# Patient Record
Sex: Male | Born: 1945 | Race: Black or African American | Hispanic: No | Marital: Married | Smoking: Former smoker
Health system: Southern US, Community
[De-identification: ages and names within clinical notes are randomized; demographics above are authoritative.]

## PROBLEM LIST (undated history)

## (undated) ENCOUNTER — Emergency Department: Admission: EM | Source: Home / Self Care

## (undated) DIAGNOSIS — I10 Essential (primary) hypertension: Secondary | ICD-10-CM

## (undated) DIAGNOSIS — J302 Other seasonal allergic rhinitis: Secondary | ICD-10-CM

## (undated) DIAGNOSIS — E559 Vitamin D deficiency, unspecified: Secondary | ICD-10-CM

## (undated) DIAGNOSIS — N400 Enlarged prostate without lower urinary tract symptoms: Secondary | ICD-10-CM

## (undated) HISTORY — DX: Vitamin D deficiency, unspecified: E55.9

## (undated) HISTORY — DX: Essential (primary) hypertension: I10

## (undated) HISTORY — DX: Other seasonal allergic rhinitis: J30.2

## (undated) HISTORY — DX: Benign prostatic hyperplasia without lower urinary tract symptoms: N40.0

---

## 2006-08-19 ENCOUNTER — Ambulatory Visit (HOSPITAL_COMMUNITY): Admission: RE | Admit: 2006-08-19 | Discharge: 2006-08-19 | Payer: Self-pay | Admitting: Internal Medicine

## 2006-10-24 ENCOUNTER — Ambulatory Visit (HOSPITAL_BASED_OUTPATIENT_CLINIC_OR_DEPARTMENT_OTHER): Admission: RE | Admit: 2006-10-24 | Discharge: 2006-10-24 | Payer: Self-pay | Admitting: General Surgery

## 2006-10-24 ENCOUNTER — Encounter (INDEPENDENT_AMBULATORY_CARE_PROVIDER_SITE_OTHER): Payer: Self-pay | Admitting: Specialist

## 2007-05-05 ENCOUNTER — Encounter: Admission: RE | Admit: 2007-05-05 | Discharge: 2007-05-05 | Payer: Self-pay | Admitting: Internal Medicine

## 2011-01-15 NOTE — Op Note (Signed)
Joseph Pace, Joseph Pace            ACCOUNT NO.:  [id number]   MEDICAL RECORD NO.:  [id number]          PATIENT TYPE:  AMB   LOCATION:  DSC                          FACILITY:  [facility]   PHYSICIAN:  Leonie Man, M.D.   DATE OF BIRTH:  16-Nov-1945   DATE OF PROCEDURE:  10/24/2006  DATE OF DISCHARGE:                               OPERATIVE REPORT   PREOPERATIVE DIAGNOSIS:  Large epidermoid cyst of posterior neck.   POSTOPERATIVE DIAGNOSIS:  Large epidermoid cyst of posterior neck.   PROCEDURE:  Excision epidermoid cyst of posterior neck.   SURGEON:  Leonie Man, M.D.   ASSISTANT:  OR nurse.   ANESTHESIA:  General.   POSITION:  Prone.   DESCRIPTION OF PROCEDURE:  Joseph Pace is a 65 year old man with a  very large epidermoid cyst measuring approximately 10 cm in greatest  diameter located in the nape of the neck centrally.  He comes to the  operating room now for excision of this mass because of increasing  discomfort and inability to close his collar.  He understand the risks  and potential benefits of surgery.   PROCEDURE:  Following the induction of satisfactory general anesthesia,  the patient is positioned in the prone position and the posterior neck  is prepped and draped to be included in a sterile operative field.  I  infiltrated this region with 0.5% Marcaine with epinephrine and then  made an elliptical incision around the region of the sebaceous cyst  inclusive of the pore.  This was deepened through the skin and  subcutaneous tissues down to the capsule of the cyst.  The cyst was  dissected free from the surrounding subcutaneous tissues, then carried  down to the trapezius muscles at the base of the neck.  The cyst was  then removed in its entirety and forwarded for pathologic evaluation.  Hemostasis assured with electrocautery and the wound cavity irrigated  with multiple aliquots of normal saline.  I used the remaining fatty  tissue of the neck to  reapproximate and close the dead space off with  interrupted 3-0 Vicryl sutures and the skin was closed with running 4-0  Monocryl suture and then reinforced with Steri-Strips.  Sterile  dressings applied.  Anesthetic reversed.  The patient removed from the  operating room to the recovery room in stable condition.  Tolerated the  procedure well.      Leonie Man, M.D.  Electronically Signed     PB/MEDQ  D:  10/24/2006  T:  10/24/2006  Job:  [id number]

## 2011-11-10 ENCOUNTER — Ambulatory Visit (HOSPITAL_COMMUNITY): Admission: RE | Admit: 2011-11-10 | Payer: Self-pay | Source: Ambulatory Visit

## 2011-11-12 ENCOUNTER — Ambulatory Visit (HOSPITAL_COMMUNITY)
Admission: RE | Admit: 2011-11-12 | Discharge: 2011-11-12 | Disposition: A | Discharge status: Home / Self Care | Payer: Medicare Other | Source: Ambulatory Visit | Attending: Internal Medicine | Admitting: Internal Medicine

## 2011-11-12 DIAGNOSIS — R9431 Abnormal electrocardiogram [ECG] [EKG]: Secondary | ICD-10-CM

## 2011-11-12 DIAGNOSIS — I079 Rheumatic tricuspid valve disease, unspecified: Secondary | ICD-10-CM | POA: Insufficient documentation

## 2011-11-12 DIAGNOSIS — I10 Essential (primary) hypertension: Secondary | ICD-10-CM

## 2011-11-12 NOTE — Progress Notes (Signed)
  Echocardiogram 2D Echocardiogram has been performed.  Joseph Pace 11/12/2011, 11:46 AM

## 2011-12-21 ENCOUNTER — Other Ambulatory Visit: Payer: Self-pay | Admitting: Gastroenterology

## 2012-10-25 ENCOUNTER — Other Ambulatory Visit (HOSPITAL_COMMUNITY): Payer: Self-pay | Admitting: Internal Medicine

## 2012-11-01 ENCOUNTER — Ambulatory Visit (HOSPITAL_COMMUNITY)
Admission: RE | Admit: 2012-11-01 | Discharge: 2012-11-01 | Disposition: A | Discharge status: Home / Self Care | Payer: Medicare Other | Source: Ambulatory Visit | Attending: Internal Medicine | Admitting: Internal Medicine

## 2012-11-01 DIAGNOSIS — M79609 Pain in unspecified limb: Secondary | ICD-10-CM | POA: Insufficient documentation

## 2012-11-01 DIAGNOSIS — M25569 Pain in unspecified knee: Secondary | ICD-10-CM

## 2012-11-01 DIAGNOSIS — R0989 Other specified symptoms and signs involving the circulatory and respiratory systems: Secondary | ICD-10-CM

## 2012-11-01 NOTE — Progress Notes (Signed)
VASCULAR LAB PRELIMINARY  ARTERIAL  ABI completed:    RIGHT    LEFT    PRESSURE WAVEFORM  PRESSURE WAVEFORM  BRACHIAL 144 triphasic BRACHIAL 150 triphasic  DP 187 triphasic DP 167 triphasic         PT 184 triphasic PT 168 biphasic                  RIGHT LEFT  ABI 1.25 1.12   ABIs and Doppler waveforms are within normal limits bilaterally. Symptoms not consistent with claudication.  SLAUGHTER, [place], RVS 11/01/2012, 11:12 AM

## 2014-11-20 DIAGNOSIS — I10 Essential (primary) hypertension: Secondary | ICD-10-CM | POA: Diagnosis not present

## 2014-11-20 DIAGNOSIS — M150 Primary generalized (osteo)arthritis: Secondary | ICD-10-CM | POA: Diagnosis not present

## 2014-11-26 DIAGNOSIS — R7309 Other abnormal glucose: Secondary | ICD-10-CM | POA: Diagnosis not present

## 2014-11-26 DIAGNOSIS — I10 Essential (primary) hypertension: Secondary | ICD-10-CM | POA: Diagnosis not present

## 2015-05-28 DIAGNOSIS — Z0000 Encounter for general adult medical examination without abnormal findings: Secondary | ICD-10-CM | POA: Diagnosis not present

## 2015-11-19 DIAGNOSIS — J309 Allergic rhinitis, unspecified: Secondary | ICD-10-CM | POA: Diagnosis not present

## 2015-11-19 DIAGNOSIS — M150 Primary generalized (osteo)arthritis: Secondary | ICD-10-CM | POA: Diagnosis not present

## 2015-11-19 DIAGNOSIS — I10 Essential (primary) hypertension: Secondary | ICD-10-CM | POA: Diagnosis not present

## 2015-11-19 DIAGNOSIS — J40 Bronchitis, not specified as acute or chronic: Secondary | ICD-10-CM | POA: Diagnosis not present

## 2015-12-23 DIAGNOSIS — Z125 Encounter for screening for malignant neoplasm of prostate: Secondary | ICD-10-CM | POA: Diagnosis not present

## 2015-12-23 DIAGNOSIS — R7309 Other abnormal glucose: Secondary | ICD-10-CM | POA: Diagnosis not present

## 2015-12-23 DIAGNOSIS — I10 Essential (primary) hypertension: Secondary | ICD-10-CM | POA: Diagnosis not present

## 2016-03-22 DIAGNOSIS — K922 Gastrointestinal hemorrhage, unspecified: Secondary | ICD-10-CM | POA: Diagnosis not present

## 2016-03-22 DIAGNOSIS — I10 Essential (primary) hypertension: Secondary | ICD-10-CM | POA: Diagnosis not present

## 2016-03-22 DIAGNOSIS — M150 Primary generalized (osteo)arthritis: Secondary | ICD-10-CM | POA: Diagnosis not present

## 2016-03-23 DIAGNOSIS — K921 Melena: Secondary | ICD-10-CM | POA: Diagnosis not present

## 2016-03-25 DIAGNOSIS — D62 Acute posthemorrhagic anemia: Secondary | ICD-10-CM | POA: Diagnosis not present

## 2016-03-25 DIAGNOSIS — D649 Anemia, unspecified: Secondary | ICD-10-CM | POA: Diagnosis not present

## 2016-03-25 DIAGNOSIS — R195 Other fecal abnormalities: Secondary | ICD-10-CM | POA: Diagnosis not present

## 2016-03-31 DIAGNOSIS — D62 Acute posthemorrhagic anemia: Secondary | ICD-10-CM | POA: Diagnosis not present

## 2016-04-21 DIAGNOSIS — M150 Primary generalized (osteo)arthritis: Secondary | ICD-10-CM | POA: Diagnosis not present

## 2016-04-21 DIAGNOSIS — I10 Essential (primary) hypertension: Secondary | ICD-10-CM | POA: Diagnosis not present

## 2016-08-18 DIAGNOSIS — I10 Essential (primary) hypertension: Secondary | ICD-10-CM | POA: Diagnosis not present

## 2016-08-18 DIAGNOSIS — M150 Primary generalized (osteo)arthritis: Secondary | ICD-10-CM | POA: Diagnosis not present

## 2016-08-18 DIAGNOSIS — K529 Noninfective gastroenteritis and colitis, unspecified: Secondary | ICD-10-CM | POA: Diagnosis not present

## 2018-04-21 DIAGNOSIS — E782 Mixed hyperlipidemia: Secondary | ICD-10-CM | POA: Diagnosis not present

## 2018-04-21 DIAGNOSIS — R739 Hyperglycemia, unspecified: Secondary | ICD-10-CM | POA: Diagnosis not present

## 2018-04-21 DIAGNOSIS — I10 Essential (primary) hypertension: Secondary | ICD-10-CM | POA: Diagnosis not present

## 2018-04-21 DIAGNOSIS — E559 Vitamin D deficiency, unspecified: Secondary | ICD-10-CM | POA: Diagnosis not present

## 2018-04-26 DIAGNOSIS — I10 Essential (primary) hypertension: Secondary | ICD-10-CM | POA: Diagnosis not present

## 2018-04-26 DIAGNOSIS — E559 Vitamin D deficiency, unspecified: Secondary | ICD-10-CM | POA: Diagnosis not present

## 2018-04-26 DIAGNOSIS — E782 Mixed hyperlipidemia: Secondary | ICD-10-CM | POA: Diagnosis not present

## 2018-04-26 DIAGNOSIS — R739 Hyperglycemia, unspecified: Secondary | ICD-10-CM | POA: Diagnosis not present

## 2018-05-16 DIAGNOSIS — I10 Essential (primary) hypertension: Secondary | ICD-10-CM | POA: Diagnosis not present

## 2018-06-20 DIAGNOSIS — R9431 Abnormal electrocardiogram [ECG] [EKG]: Secondary | ICD-10-CM | POA: Diagnosis not present

## 2018-10-03 DIAGNOSIS — E559 Vitamin D deficiency, unspecified: Secondary | ICD-10-CM | POA: Diagnosis not present

## 2018-10-03 DIAGNOSIS — I10 Essential (primary) hypertension: Secondary | ICD-10-CM | POA: Diagnosis not present

## 2018-10-03 DIAGNOSIS — Z0000 Encounter for general adult medical examination without abnormal findings: Secondary | ICD-10-CM | POA: Diagnosis not present

## 2018-10-03 DIAGNOSIS — R739 Hyperglycemia, unspecified: Secondary | ICD-10-CM | POA: Diagnosis not present

## 2018-10-25 DIAGNOSIS — R739 Hyperglycemia, unspecified: Secondary | ICD-10-CM | POA: Diagnosis not present

## 2018-10-25 DIAGNOSIS — I10 Essential (primary) hypertension: Secondary | ICD-10-CM | POA: Diagnosis not present

## 2018-10-25 DIAGNOSIS — E559 Vitamin D deficiency, unspecified: Secondary | ICD-10-CM | POA: Diagnosis not present

## 2018-10-25 DIAGNOSIS — E782 Mixed hyperlipidemia: Secondary | ICD-10-CM | POA: Diagnosis not present

## 2019-02-07 DIAGNOSIS — E559 Vitamin D deficiency, unspecified: Secondary | ICD-10-CM | POA: Diagnosis not present

## 2019-02-07 DIAGNOSIS — R739 Hyperglycemia, unspecified: Secondary | ICD-10-CM | POA: Diagnosis not present

## 2019-02-07 DIAGNOSIS — E782 Mixed hyperlipidemia: Secondary | ICD-10-CM | POA: Diagnosis not present

## 2019-02-07 DIAGNOSIS — I10 Essential (primary) hypertension: Secondary | ICD-10-CM | POA: Diagnosis not present

## 2019-03-23 DIAGNOSIS — Z03818 Encounter for observation for suspected exposure to other biological agents ruled out: Secondary | ICD-10-CM | POA: Diagnosis not present

## 2019-03-23 DIAGNOSIS — Z7189 Other specified counseling: Secondary | ICD-10-CM | POA: Diagnosis not present

## 2019-03-29 DIAGNOSIS — R21 Rash and other nonspecific skin eruption: Secondary | ICD-10-CM | POA: Diagnosis not present

## 2019-04-04 DIAGNOSIS — B029 Zoster without complications: Secondary | ICD-10-CM | POA: Diagnosis not present

## 2019-04-16 DIAGNOSIS — B029 Zoster without complications: Secondary | ICD-10-CM | POA: Diagnosis not present

## 2019-04-16 DIAGNOSIS — R739 Hyperglycemia, unspecified: Secondary | ICD-10-CM | POA: Diagnosis not present

## 2019-04-16 DIAGNOSIS — I10 Essential (primary) hypertension: Secondary | ICD-10-CM | POA: Diagnosis not present

## 2019-06-11 DIAGNOSIS — B029 Zoster without complications: Secondary | ICD-10-CM | POA: Diagnosis not present

## 2019-06-11 DIAGNOSIS — E782 Mixed hyperlipidemia: Secondary | ICD-10-CM | POA: Diagnosis not present

## 2019-06-11 DIAGNOSIS — R739 Hyperglycemia, unspecified: Secondary | ICD-10-CM | POA: Diagnosis not present

## 2019-06-11 DIAGNOSIS — E559 Vitamin D deficiency, unspecified: Secondary | ICD-10-CM | POA: Diagnosis not present

## 2019-06-11 DIAGNOSIS — I10 Essential (primary) hypertension: Secondary | ICD-10-CM | POA: Diagnosis not present

## 2019-11-08 DIAGNOSIS — I10 Essential (primary) hypertension: Secondary | ICD-10-CM | POA: Diagnosis not present

## 2019-11-08 DIAGNOSIS — E782 Mixed hyperlipidemia: Secondary | ICD-10-CM | POA: Diagnosis not present

## 2019-11-08 DIAGNOSIS — R739 Hyperglycemia, unspecified: Secondary | ICD-10-CM | POA: Diagnosis not present

## 2019-11-12 DIAGNOSIS — H2511 Age-related nuclear cataract, right eye: Secondary | ICD-10-CM | POA: Diagnosis not present

## 2019-11-12 DIAGNOSIS — I10 Essential (primary) hypertension: Secondary | ICD-10-CM | POA: Diagnosis not present

## 2019-11-12 DIAGNOSIS — H25013 Cortical age-related cataract, bilateral: Secondary | ICD-10-CM | POA: Diagnosis not present

## 2019-11-12 DIAGNOSIS — H25043 Posterior subcapsular polar age-related cataract, bilateral: Secondary | ICD-10-CM | POA: Diagnosis not present

## 2019-11-12 DIAGNOSIS — H2513 Age-related nuclear cataract, bilateral: Secondary | ICD-10-CM | POA: Diagnosis not present

## 2019-12-11 DIAGNOSIS — H25811 Combined forms of age-related cataract, right eye: Secondary | ICD-10-CM | POA: Diagnosis not present

## 2019-12-11 DIAGNOSIS — H2511 Age-related nuclear cataract, right eye: Secondary | ICD-10-CM | POA: Diagnosis not present

## 2019-12-12 DIAGNOSIS — H25012 Cortical age-related cataract, left eye: Secondary | ICD-10-CM | POA: Diagnosis not present

## 2019-12-12 DIAGNOSIS — H25042 Posterior subcapsular polar age-related cataract, left eye: Secondary | ICD-10-CM | POA: Diagnosis not present

## 2019-12-12 DIAGNOSIS — H2512 Age-related nuclear cataract, left eye: Secondary | ICD-10-CM | POA: Diagnosis not present

## 2019-12-18 DIAGNOSIS — H2512 Age-related nuclear cataract, left eye: Secondary | ICD-10-CM | POA: Diagnosis not present

## 2019-12-18 DIAGNOSIS — H25812 Combined forms of age-related cataract, left eye: Secondary | ICD-10-CM | POA: Diagnosis not present

## 2019-12-28 DIAGNOSIS — I10 Essential (primary) hypertension: Secondary | ICD-10-CM | POA: Diagnosis not present

## 2019-12-28 DIAGNOSIS — E7849 Other hyperlipidemia: Secondary | ICD-10-CM | POA: Diagnosis not present

## 2020-01-30 DIAGNOSIS — Z03818 Encounter for observation for suspected exposure to other biological agents ruled out: Secondary | ICD-10-CM | POA: Diagnosis not present

## 2020-02-27 DIAGNOSIS — E782 Mixed hyperlipidemia: Secondary | ICD-10-CM | POA: Diagnosis not present

## 2020-02-27 DIAGNOSIS — R739 Hyperglycemia, unspecified: Secondary | ICD-10-CM | POA: Diagnosis not present

## 2020-02-27 DIAGNOSIS — I10 Essential (primary) hypertension: Secondary | ICD-10-CM | POA: Diagnosis not present

## 2020-05-04 DIAGNOSIS — H04123 Dry eye syndrome of bilateral lacrimal glands: Secondary | ICD-10-CM | POA: Diagnosis not present

## 2020-05-28 DIAGNOSIS — Z20822 Contact with and (suspected) exposure to covid-19: Secondary | ICD-10-CM | POA: Diagnosis not present

## 2020-06-30 DIAGNOSIS — I10 Essential (primary) hypertension: Secondary | ICD-10-CM | POA: Diagnosis not present

## 2020-06-30 DIAGNOSIS — Z23 Encounter for immunization: Secondary | ICD-10-CM | POA: Diagnosis not present

## 2020-06-30 DIAGNOSIS — E782 Mixed hyperlipidemia: Secondary | ICD-10-CM | POA: Diagnosis not present

## 2020-06-30 DIAGNOSIS — R739 Hyperglycemia, unspecified: Secondary | ICD-10-CM | POA: Diagnosis not present

## 2020-11-04 DIAGNOSIS — E559 Vitamin D deficiency, unspecified: Secondary | ICD-10-CM | POA: Diagnosis not present

## 2020-11-04 DIAGNOSIS — E782 Mixed hyperlipidemia: Secondary | ICD-10-CM | POA: Diagnosis not present

## 2020-11-04 DIAGNOSIS — I10 Essential (primary) hypertension: Secondary | ICD-10-CM | POA: Diagnosis not present

## 2020-11-04 DIAGNOSIS — E7849 Other hyperlipidemia: Secondary | ICD-10-CM | POA: Diagnosis not present

## 2020-11-04 DIAGNOSIS — R739 Hyperglycemia, unspecified: Secondary | ICD-10-CM | POA: Diagnosis not present

## 2021-03-10 DIAGNOSIS — E559 Vitamin D deficiency, unspecified: Secondary | ICD-10-CM | POA: Diagnosis not present

## 2021-03-10 DIAGNOSIS — E782 Mixed hyperlipidemia: Secondary | ICD-10-CM | POA: Diagnosis not present

## 2021-03-10 DIAGNOSIS — I10 Essential (primary) hypertension: Secondary | ICD-10-CM | POA: Diagnosis not present

## 2021-03-29 DIAGNOSIS — E7849 Other hyperlipidemia: Secondary | ICD-10-CM | POA: Diagnosis not present

## 2021-03-29 DIAGNOSIS — I10 Essential (primary) hypertension: Secondary | ICD-10-CM | POA: Diagnosis not present

## 2021-04-13 DIAGNOSIS — I10 Essential (primary) hypertension: Secondary | ICD-10-CM | POA: Diagnosis not present

## 2021-04-13 DIAGNOSIS — E782 Mixed hyperlipidemia: Secondary | ICD-10-CM | POA: Diagnosis not present

## 2021-06-29 DIAGNOSIS — I10 Essential (primary) hypertension: Secondary | ICD-10-CM | POA: Diagnosis not present

## 2021-06-29 DIAGNOSIS — E7849 Other hyperlipidemia: Secondary | ICD-10-CM | POA: Diagnosis not present

## 2021-07-14 DIAGNOSIS — M130 Polyarthritis, unspecified: Secondary | ICD-10-CM | POA: Diagnosis not present

## 2021-07-14 DIAGNOSIS — Z0001 Encounter for general adult medical examination with abnormal findings: Secondary | ICD-10-CM | POA: Diagnosis not present

## 2021-07-14 DIAGNOSIS — I10 Essential (primary) hypertension: Secondary | ICD-10-CM | POA: Diagnosis not present

## 2021-07-14 DIAGNOSIS — F4381 Prolonged grief disorder: Secondary | ICD-10-CM | POA: Diagnosis not present

## 2021-07-14 DIAGNOSIS — E782 Mixed hyperlipidemia: Secondary | ICD-10-CM | POA: Diagnosis not present

## 2021-07-14 DIAGNOSIS — R739 Hyperglycemia, unspecified: Secondary | ICD-10-CM | POA: Diagnosis not present

## 2021-07-14 DIAGNOSIS — E559 Vitamin D deficiency, unspecified: Secondary | ICD-10-CM | POA: Diagnosis not present

## 2021-09-17 DIAGNOSIS — R3915 Urgency of urination: Secondary | ICD-10-CM | POA: Diagnosis not present

## 2021-10-26 DIAGNOSIS — R3915 Urgency of urination: Secondary | ICD-10-CM | POA: Diagnosis not present

## 2021-12-10 DIAGNOSIS — R42 Dizziness and giddiness: Secondary | ICD-10-CM | POA: Diagnosis not present

## 2021-12-10 DIAGNOSIS — R739 Hyperglycemia, unspecified: Secondary | ICD-10-CM | POA: Diagnosis not present

## 2021-12-10 DIAGNOSIS — I10 Essential (primary) hypertension: Secondary | ICD-10-CM | POA: Diagnosis not present

## 2021-12-10 DIAGNOSIS — F4381 Prolonged grief disorder: Secondary | ICD-10-CM | POA: Diagnosis not present

## 2021-12-10 DIAGNOSIS — E782 Mixed hyperlipidemia: Secondary | ICD-10-CM | POA: Diagnosis not present

## 2021-12-10 DIAGNOSIS — E559 Vitamin D deficiency, unspecified: Secondary | ICD-10-CM | POA: Diagnosis not present

## 2021-12-10 DIAGNOSIS — M130 Polyarthritis, unspecified: Secondary | ICD-10-CM | POA: Diagnosis not present

## 2022-01-11 ENCOUNTER — Ambulatory Visit
Admission: RE | Admit: 2022-01-11 | Discharge: 2022-01-11 | Disposition: A | Discharge status: Home / Self Care | Payer: Medicare Other | Source: Ambulatory Visit | Attending: Family Medicine | Admitting: Family Medicine

## 2022-01-11 ENCOUNTER — Other Ambulatory Visit: Payer: Self-pay | Admitting: Family Medicine

## 2022-01-11 DIAGNOSIS — D696 Thrombocytopenia, unspecified: Secondary | ICD-10-CM

## 2022-01-11 DIAGNOSIS — I10 Essential (primary) hypertension: Secondary | ICD-10-CM | POA: Diagnosis not present

## 2022-01-11 DIAGNOSIS — R202 Paresthesia of skin: Secondary | ICD-10-CM

## 2022-01-11 DIAGNOSIS — M130 Polyarthritis, unspecified: Secondary | ICD-10-CM | POA: Diagnosis not present

## 2022-01-11 DIAGNOSIS — R739 Hyperglycemia, unspecified: Secondary | ICD-10-CM | POA: Diagnosis not present

## 2022-01-11 DIAGNOSIS — Z0000 Encounter for general adult medical examination without abnormal findings: Secondary | ICD-10-CM | POA: Diagnosis not present

## 2022-01-11 DIAGNOSIS — M6281 Muscle weakness (generalized): Secondary | ICD-10-CM | POA: Diagnosis not present

## 2022-01-11 DIAGNOSIS — R200 Anesthesia of skin: Secondary | ICD-10-CM | POA: Diagnosis not present

## 2022-01-14 DIAGNOSIS — F4381 Prolonged grief disorder: Secondary | ICD-10-CM | POA: Diagnosis not present

## 2022-01-14 DIAGNOSIS — I10 Essential (primary) hypertension: Secondary | ICD-10-CM | POA: Diagnosis not present

## 2022-01-14 DIAGNOSIS — E782 Mixed hyperlipidemia: Secondary | ICD-10-CM | POA: Diagnosis not present

## 2022-01-14 DIAGNOSIS — R739 Hyperglycemia, unspecified: Secondary | ICD-10-CM | POA: Diagnosis not present

## 2022-01-28 ENCOUNTER — Telehealth: Payer: Self-pay | Admitting: Physician Assistant

## 2022-01-28 DIAGNOSIS — D696 Thrombocytopenia, unspecified: Secondary | ICD-10-CM | POA: Diagnosis not present

## 2022-01-28 DIAGNOSIS — I10 Essential (primary) hypertension: Secondary | ICD-10-CM | POA: Diagnosis not present

## 2022-01-28 DIAGNOSIS — M130 Polyarthritis, unspecified: Secondary | ICD-10-CM | POA: Diagnosis not present

## 2022-01-28 DIAGNOSIS — E782 Mixed hyperlipidemia: Secondary | ICD-10-CM | POA: Diagnosis not present

## 2022-01-28 NOTE — Telephone Encounter (Signed)
Scheduled appt per 6/1 referral. Pt is aware of appt date and time. Pt is aware to arrive 15 mins prior to appt time and to bring and updated insurance card. Pt is aware of appt location.   

## 2022-02-16 NOTE — Progress Notes (Unsigned)
[facility] [phone]   [phone]  INITIAL CONSULT NOTE  Patient Care Team: Laurena Slimmer, MD (Inactive) as PCP - General (Endocrinology)  Hematological/Oncological History 1) Labs from PCP, [facility]: -01/14/2022: WBC 5.7, Hgb 14.9, Plt 92K  2) 02/17/2022: Establish care with [facility]  CHIEF COMPLAINTS/PURPOSE OF CONSULTATION:  Thrombocytopenia  HISTORY OF PRESENTING ILLNESS:  Joseph Pace 76 y.o. male with medical history significant for BPH, HTN, vitamin D deficiency and seasonal allergies.   On exam today, Mr. Wolbert reports his energy levels are mildly decreased but he continues to complete all his daily activities on his own.  He denies any appetite changes or weight loss.  He denies any nausea, vomiting or abdominal pain.  His bowel habits are unchanged without any recurrent episodes of diarrhea or constipation.  He denies easy bruising or signs of active bleeding.  This includes hematochezia, melena, hematuria, epistaxis, hemoptysis or gingival bleeding.  He denies any fevers, chills, night sweats, shortness of breath, chest pain, cough, dizziness, headaches or syncopal episodes.  He has no other complaints.  Rest of the 10 point ROS is below.  MEDICAL HISTORY:  Past Medical History:  Diagnosis Date   BPH (benign prostatic hyperplasia)    HTN (hypertension)    Seasonal allergies    Vitamin D deficiency     SURGICAL HISTORY: History reviewed. No pertinent surgical history.  SOCIAL HISTORY: Social History   Socioeconomic History   Marital status: Married    Spouse name: Not on file   Number of children: Not on file   Years of education: Not on file   Highest education level: Not on file  Occupational History   Not on file  Tobacco Use   Smoking status: Former    Years: 20.00    Types: Cigarettes   Smokeless tobacco: Never   Tobacco comments:    Quit smoking in 1980's  Substance and Sexual Activity    Alcohol use: Yes    Alcohol/week: 3.0 standard drinks of alcohol    Types: 3 Cans of beer per week   Drug use: Never   Sexual activity: Not on file  Other Topics Concern   Not on file  Social History Narrative   Not on file   Social Determinants of Health   Financial Resource Strain: Not on file  Food Insecurity: Not on file  Transportation Needs: Not on file  Physical Activity: Not on file  Stress: Not on file  Social Connections: Not on file  Intimate Partner Violence: Not on file    FAMILY HISTORY: History reviewed. No pertinent family history.  ALLERGIES:  has No Known Allergies.  MEDICATIONS:  Current Outpatient Medications  Medication Sig Dispense Refill   amLODipine-benazepril (LOTREL) 10-40 MG capsule Take 1 capsule by mouth daily.     Boswellia-Glucosamine-Vit D (OSTEO BI-FLEX ONE PER DAY PO) Take by mouth.     cetirizine (ZYRTEC) 10 MG tablet Take 10 mg by mouth daily.     Cholecalciferol (VITAMIN D3) 1.25 MG (50000 UT) CAPS Take 1 capsule by mouth once a week.     diclofenac Sodium (VOLTAREN) 1 % GEL Apply topically as needed.     montelukast (SINGULAIR) 10 MG tablet Take 10 mg by mouth at bedtime.     Omega-3 Fatty Acids (FISH OIL PO) Take by mouth daily.     tamsulosin (FLOMAX) 0.4 MG CAPS capsule Take 0.4 mg by mouth daily.     Turmeric (QC TUMERIC COMPLEX) 500  MG CAPS Take by mouth.     No current facility-administered medications for this visit.    REVIEW OF SYSTEMS:   Constitutional: ( - ) fevers, ( - )  chills , ( - ) night sweats Eyes: ( - ) blurriness of vision, ( - ) double vision, ( - ) watery eyes Ears, nose, mouth, throat, and face: ( - ) mucositis, ( - ) sore throat Respiratory: ( - ) cough, ( - ) dyspnea, ( - ) wheezes Cardiovascular: ( - ) palpitation, ( - ) chest discomfort, ( - ) lower extremity swelling Gastrointestinal:  ( - ) nausea, ( - ) heartburn, ( - ) change in bowel habits Skin: ( - ) abnormal skin rashes Lymphatics: ( - ) new  lymphadenopathy, ( - ) easy bruising Neurological: ( - ) numbness, ( - ) tingling, ( - ) new weaknesses Behavioral/Psych: ( - ) mood change, ( - ) new changes  All other systems were reviewed with the patient and are negative.  PHYSICAL EXAMINATION: ECOG PERFORMANCE STATUS: 1 - Symptomatic but completely ambulatory  Vitals:   02/17/22 0910  BP: 134/71  Pulse: 72  Resp: 20  Temp: 97.7 F (36.5 C)  SpO2: 98%   Filed Weights   02/17/22 0910  Weight: 264 lb 4.8 oz (119.9 kg)    GENERAL: well appearing male in NAD  SKIN: skin color, texture, turgor are normal, no rashes or significant lesions EYES: conjunctiva are pink and non-injected, sclera clear OROPHARYNX: no exudate, no erythema; lips, buccal mucosa, and tongue normal  NECK: supple, non-tender LYMPH:  no palpable lymphadenopathy in the cervical or supraclavicular lymph nodes.  LUNGS: clear to auscultation and percussion with normal breathing effort HEART: regular rate & rhythm and no murmurs and no lower extremity edema ABDOMEN: soft, non-tender, non-distended, normal bowel sounds Musculoskeletal: no cyanosis of digits and no clubbing  PSYCH: alert & oriented x 3, fluent speech NEURO: no focal motor/sensory deficits  LABORATORY DATA:  I have reviewed the data as listed    Latest Ref Rng & Units 02/17/2022   10:21 AM  CBC  WBC 4.0 - 10.5 K/uL 5.7   Hemoglobin 13.0 - 17.0 g/dL 85.4   Hematocrit 62.7 - 52.0 % 44.9   Platelets 150 - 400 K/uL 175        Latest Ref Rng & Units 02/17/2022   10:21 AM  CMP  Glucose 70 - 99 mg/dL 035   BUN 8 - 23 mg/dL 11   Creatinine 0.09 - 1.24 mg/dL 3.81   Sodium 829 - 937 mmol/L 139   Potassium 3.5 - 5.1 mmol/L 3.9   Chloride 98 - 111 mmol/L 106   CO2 22 - 32 mmol/L 28   Calcium 8.9 - 10.3 mg/dL 16.9   Total Protein 6.5 - 8.1 g/dL 8.0   Total Bilirubin 0.3 - 1.2 mg/dL 0.5   Alkaline Phos 38 - 126 U/L 49   AST 15 - 41 U/L 12   ALT 0 - 44 U/L 17    RADIOGRAPHIC STUDIES: I have  personally reviewed the radiological images as listed and agreed with the findings in the report. No results found.  ASSESSMENT & PLAN Joseph Pace is a 76 y.o. male who presents to the hematology clinic for initial evaluation for thrombocytopenia. I reviewed potential etiologies for thrombocytopenia including liver disease, splenomegaly, infectious processes, nutritional anemias, immune mediated and bone marrow disorders. Patient is not taking any medications and denies any recent infections. Patient will  proceed with serologic workup today to check CBC, CMP, Vitamin B12, MMA, folate, LDH, Hepatitis B and C serologies, HIV serology, immature platelet fraction and save smear.   #Thrombocytopenia, etiology unknown: --Labs today to check CBC w/diff, CMP, B12 level, folate level, Hep B and C serologies, HIV serology. --Evaluate for platelet abnormality with save smear. --If above workup is negative and there is persistent thrombocytopenia, we will evaluate for liver disease and/or splenomegaly with abdominal US --RTC based on above workup.    Orders Placed This Encounter  Procedures   CBC with Differential (Cancer Center Only)    Standing Status:   Future    Number of Occurrences:   1    Standing Expiration Date:   02/17/2023   CMP (Cancer Center only)    Standing Status:   Future    Number of Occurrences:   1    Standing Expiration Date:   02/17/2023   Immature Platelet Fraction    Standing Status:   Future    Number of Occurrences:   1    Standing Expiration Date:   02/17/2023   Vitamin B12    Standing Status:   Future    Number of Occurrences:   1    Standing Expiration Date:   02/16/2023   Methylmalonic acid, serum    Standing Status:   Future    Number of Occurrences:   1    Standing Expiration Date:   02/16/2023   Folate, Serum    Standing Status:   Future    Number of Occurrences:   1    Standing Expiration Date:   02/16/2023   Hepatitis B core antibody, total    Standing  Status:   Future    Number of Occurrences:   1    Standing Expiration Date:   02/16/2023   Hepatitis B surface antibody    Standing Status:   Future    Number of Occurrences:   1    Standing Expiration Date:   02/16/2023   Hepatitis B surface antigen    Standing Status:   Future    Number of Occurrences:   1    Standing Expiration Date:   02/16/2023   Hepatitis C antibody    Standing Status:   Future    Number of Occurrences:   1    Standing Expiration Date:   02/16/2023   HIV antibody (with reflex)    Standing Status:   Future    Number of Occurrences:   1    Standing Expiration Date:   02/16/2023   Save Smear (SSMR)    Standing Status:   Future    Number of Occurrences:   1    Standing Expiration Date:   02/18/2023    All questions were answered. The patient knows to call the clinic with any problems, questions or concerns.  I have spent a total of 60 minutes minutes of face-to-face and non-face-to-face time, preparing to see the patient, obtaining and/or reviewing separately obtained history, performing a medically appropriate examination, counseling and educating the patient, ordering tests/procedures, documenting clinical information in the electronic health record, and communicating results to the patient, and care coordination.   Georga Kaufmann, PA-C Department of Hematology/Oncology [facility] Phone: [phone]  Patient was seen with Dr. Leonides Schanz.

## 2022-02-17 ENCOUNTER — Encounter: Payer: Self-pay | Admitting: Physician Assistant

## 2022-02-17 ENCOUNTER — Other Ambulatory Visit: Payer: Self-pay

## 2022-02-17 ENCOUNTER — Inpatient Hospital Stay: Payer: Medicare Other | Attending: Physician Assistant | Admitting: Physician Assistant

## 2022-02-17 ENCOUNTER — Inpatient Hospital Stay: Payer: Medicare Other

## 2022-02-17 VITALS — BP 134/71 | HR 72 | Temp 97.7°F | Resp 20 | Wt 264.3 lb

## 2022-02-17 DIAGNOSIS — D696 Thrombocytopenia, unspecified: Secondary | ICD-10-CM | POA: Diagnosis not present

## 2022-02-17 DIAGNOSIS — I10 Essential (primary) hypertension: Secondary | ICD-10-CM | POA: Insufficient documentation

## 2022-02-17 DIAGNOSIS — N400 Enlarged prostate without lower urinary tract symptoms: Secondary | ICD-10-CM | POA: Diagnosis not present

## 2022-02-17 DIAGNOSIS — Z87891 Personal history of nicotine dependence: Secondary | ICD-10-CM | POA: Diagnosis not present

## 2022-02-17 DIAGNOSIS — Z79899 Other long term (current) drug therapy: Secondary | ICD-10-CM | POA: Diagnosis not present

## 2022-02-17 DIAGNOSIS — E559 Vitamin D deficiency, unspecified: Secondary | ICD-10-CM | POA: Diagnosis not present

## 2022-02-17 LAB — CMP (CANCER CENTER ONLY)
ALT: 17 U/L (ref 0–44)
AST: 12 U/L — ABNORMAL LOW (ref 15–41)
Albumin: 4.5 g/dL (ref 3.5–5.0)
Alkaline Phosphatase: 49 U/L (ref 38–126)
Anion gap: 5 (ref 5–15)
BUN: 11 mg/dL (ref 8–23)
CO2: 28 mmol/L (ref 22–32)
Calcium: 10.5 mg/dL — ABNORMAL HIGH (ref 8.9–10.3)
Chloride: 106 mmol/L (ref 98–111)
Creatinine: 1.12 mg/dL (ref 0.61–1.24)
GFR, Estimated: >60 mL/min
Glucose, Bld: 117 mg/dL — ABNORMAL HIGH (ref 70–99)
Potassium: 3.9 mmol/L (ref 3.5–5.1)
Sodium: 139 mmol/L (ref 135–145)
Total Bilirubin: 0.5 mg/dL (ref 0.3–1.2)
Total Protein: 8 g/dL (ref 6.5–8.1)

## 2022-02-17 LAB — CBC WITH DIFFERENTIAL (CANCER CENTER ONLY)
Abs Immature Granulocytes: 0.02 10*3/uL (ref 0.00–0.07)
Basophils Absolute: 0 10*3/uL (ref 0.0–0.1)
Basophils Relative: 0 %
Eosinophils Absolute: 0.1 10*3/uL (ref 0.0–0.5)
Eosinophils Relative: 1 %
HCT: 44.9 % (ref 39.0–52.0)
Hemoglobin: 14.9 g/dL (ref 13.0–17.0)
Immature Granulocytes: 0 %
Lymphocytes Relative: 33 %
Lymphs Abs: 1.9 10*3/uL (ref 0.7–4.0)
MCH: 27 pg (ref 26.0–34.0)
MCHC: 33.2 g/dL (ref 30.0–36.0)
MCV: 81.3 fL (ref 80.0–100.0)
Monocytes Absolute: 0.4 10*3/uL (ref 0.1–1.0)
Monocytes Relative: 7 %
Neutro Abs: 3.3 10*3/uL (ref 1.7–7.7)
Neutrophils Relative %: 59 %
Platelet Count: 175 10*3/uL (ref 150–400)
RBC: 5.52 MIL/uL (ref 4.22–5.81)
RDW: 14.6 % (ref 11.5–15.5)
WBC Count: 5.7 10*3/uL (ref 4.0–10.5)
nRBC: 0 % (ref 0.0–0.2)

## 2022-02-17 LAB — HEPATITIS B CORE ANTIBODY, TOTAL: Hep B Core Total Ab: NONREACTIVE

## 2022-02-17 LAB — HEPATITIS B SURFACE ANTIGEN: Hepatitis B Surface Ag: NONREACTIVE

## 2022-02-17 LAB — HEPATITIS B SURFACE ANTIBODY,QUALITATIVE: Hep B S Ab: NONREACTIVE

## 2022-02-17 LAB — FOLATE: Folate: 15.5 ng/mL

## 2022-02-17 LAB — SAVE SMEAR(SSMR), FOR PROVIDER SLIDE REVIEW

## 2022-02-17 LAB — HEPATITIS C ANTIBODY: HCV Ab: NONREACTIVE

## 2022-02-17 LAB — VITAMIN B12: Vitamin B-12: 315 pg/mL (ref 180–914)

## 2022-02-17 LAB — IMMATURE PLATELET FRACTION: Immature Platelet Fraction: 3.4 % (ref 1.2–8.6)

## 2022-02-18 LAB — HIV ANTIBODY (ROUTINE TESTING W REFLEX): HIV Screen 4th Generation wRfx: NONREACTIVE

## 2022-03-01 ENCOUNTER — Telehealth: Payer: Self-pay | Admitting: Physician Assistant

## 2022-03-01 NOTE — Telephone Encounter (Signed)
Spoke to Joseph Pace to review the lab results from 02/17/2022.  Findings show that CBC is unremarkable with platelet counts within normal limits.  Rest of the work-up was negative except for mild hypercalcemia has been present in outside labs from Joseph Pace PCP.  No further intervention is required at this time and Joseph Pace can follow-up in our clinic as needed.  Advised Joseph Pace to follow-up with Joseph Pace PCP to monitor Joseph Pace calcium levels.  We will fax our lab results to Dr. Parke Simmers for her records.  Joseph Pace expressed understanding and satisfaction with the plan provided

## 2022-03-17 DIAGNOSIS — I10 Essential (primary) hypertension: Secondary | ICD-10-CM | POA: Diagnosis not present

## 2022-03-17 DIAGNOSIS — F4381 Prolonged grief disorder: Secondary | ICD-10-CM | POA: Diagnosis not present

## 2022-03-17 DIAGNOSIS — D696 Thrombocytopenia, unspecified: Secondary | ICD-10-CM | POA: Diagnosis not present

## 2022-05-29 DIAGNOSIS — I10 Essential (primary) hypertension: Secondary | ICD-10-CM | POA: Diagnosis not present

## 2022-05-29 DIAGNOSIS — E7849 Other hyperlipidemia: Secondary | ICD-10-CM | POA: Diagnosis not present

## 2022-06-15 DIAGNOSIS — M130 Polyarthritis, unspecified: Secondary | ICD-10-CM | POA: Diagnosis not present

## 2022-06-15 DIAGNOSIS — E782 Mixed hyperlipidemia: Secondary | ICD-10-CM | POA: Diagnosis not present

## 2022-06-15 DIAGNOSIS — R7309 Other abnormal glucose: Secondary | ICD-10-CM | POA: Diagnosis not present

## 2022-06-15 DIAGNOSIS — I10 Essential (primary) hypertension: Secondary | ICD-10-CM | POA: Diagnosis not present

## 2022-06-15 DIAGNOSIS — F4381 Prolonged grief disorder: Secondary | ICD-10-CM | POA: Diagnosis not present

## 2022-09-27 IMAGING — CR DG CERVICAL SPINE COMPLETE 4+V
7 series · 7 of 7 positions shown · non-contrast
Comparison: None Available.

CLINICAL DATA: Left upper extremity weakness and paresthesia.

EXAM:
CERVICAL SPINE - COMPLETE 4+ VIEW

[w cervical spine lat]
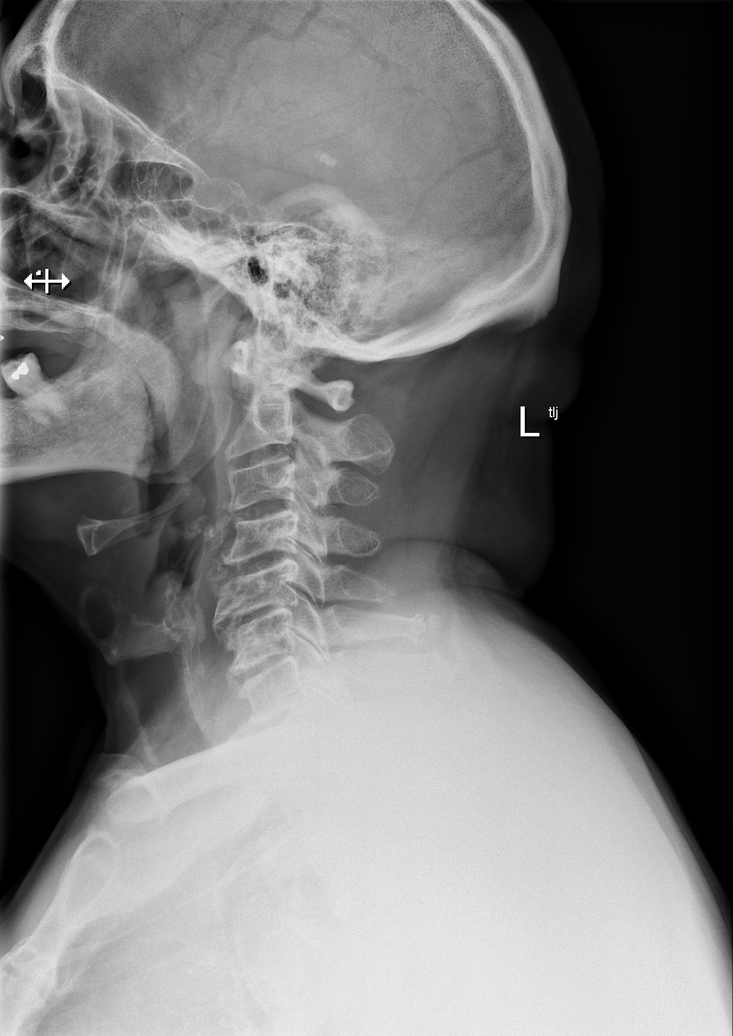

[w cervical spine ap_obl (1 of 2)]
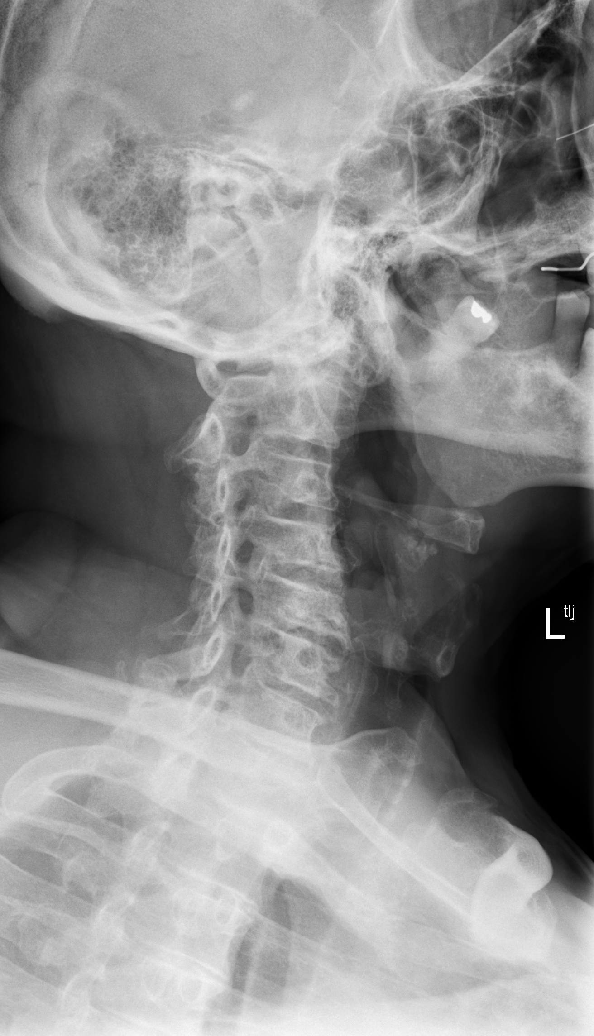

[w cervical spine ap_obl (2 of 2)]
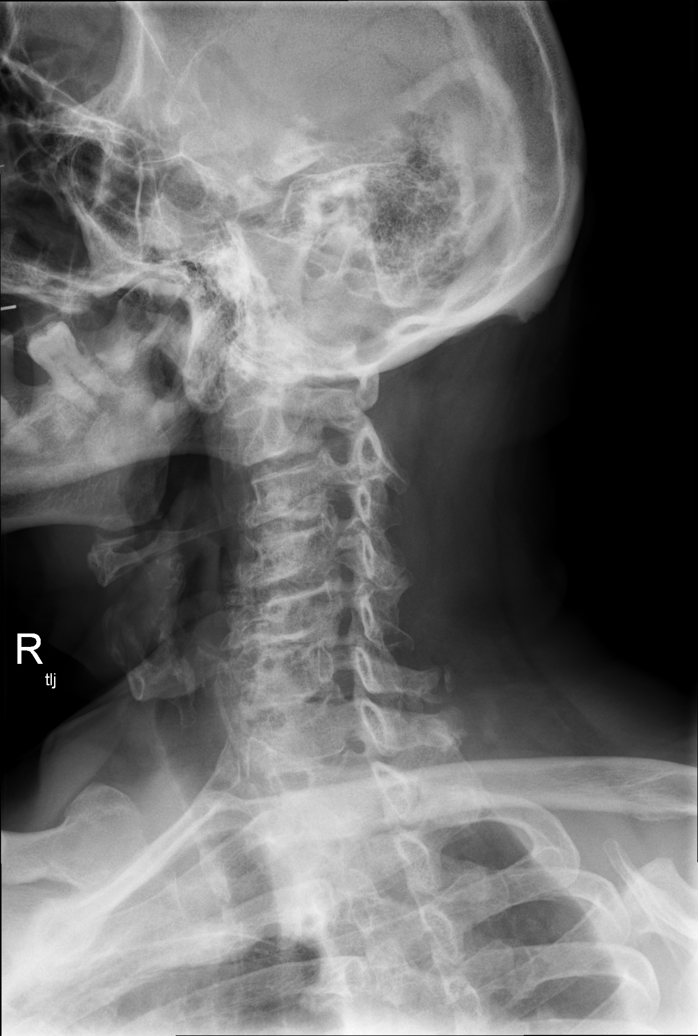

[w cervical spine ap]
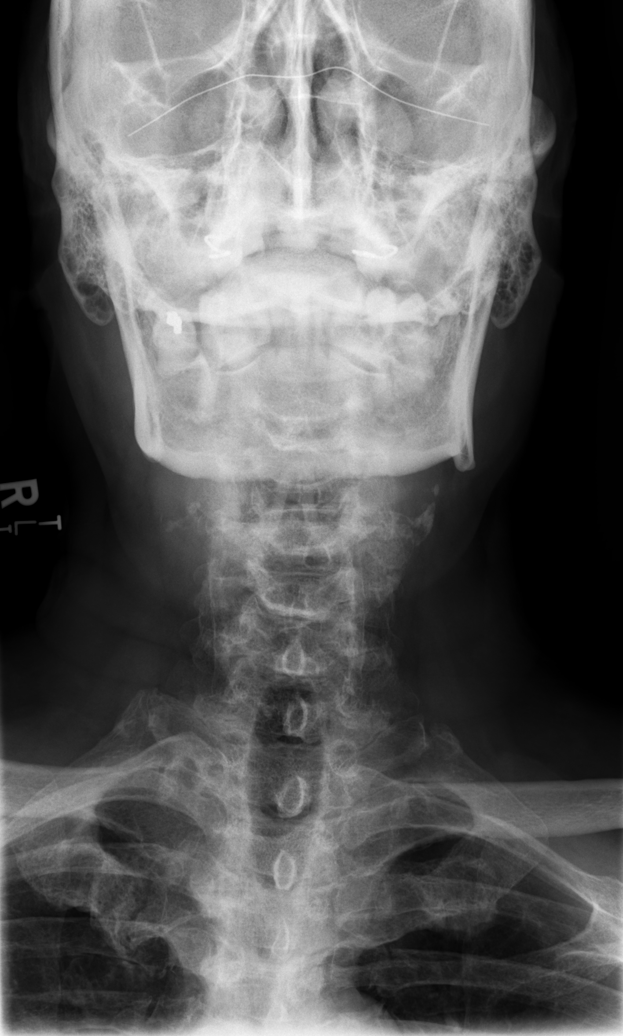

[w cervical spine odontoid (1 of 3)]
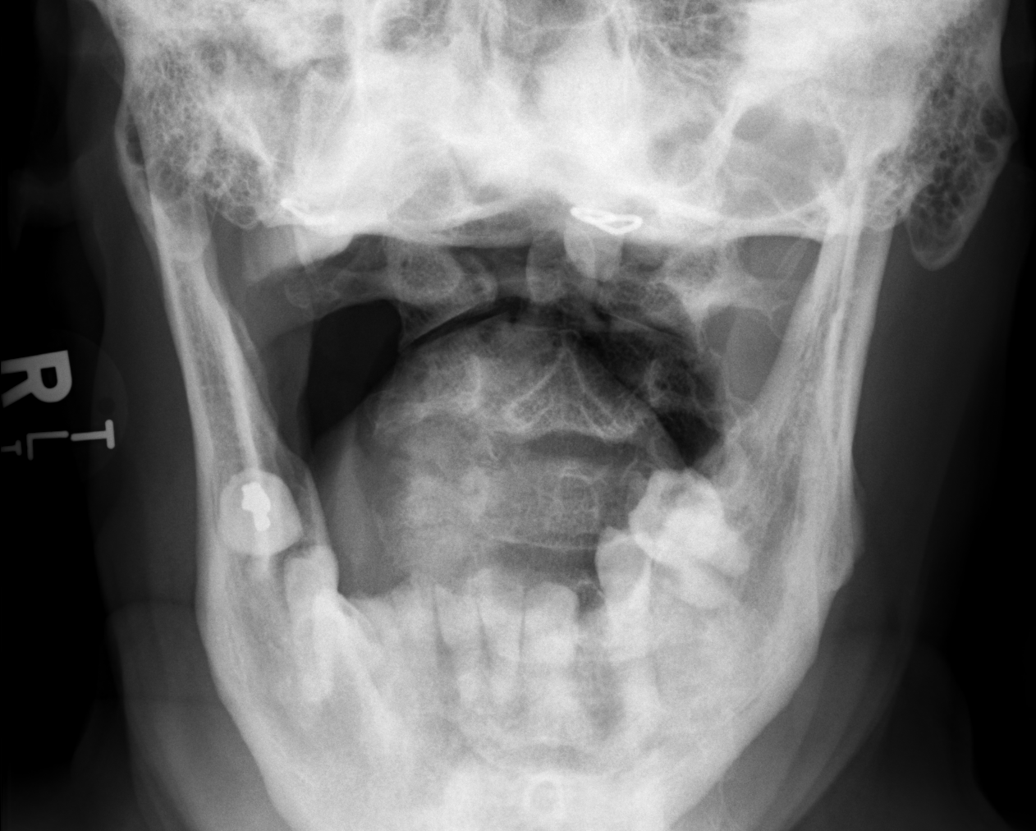

[w cervical spine odontoid (2 of 3)]
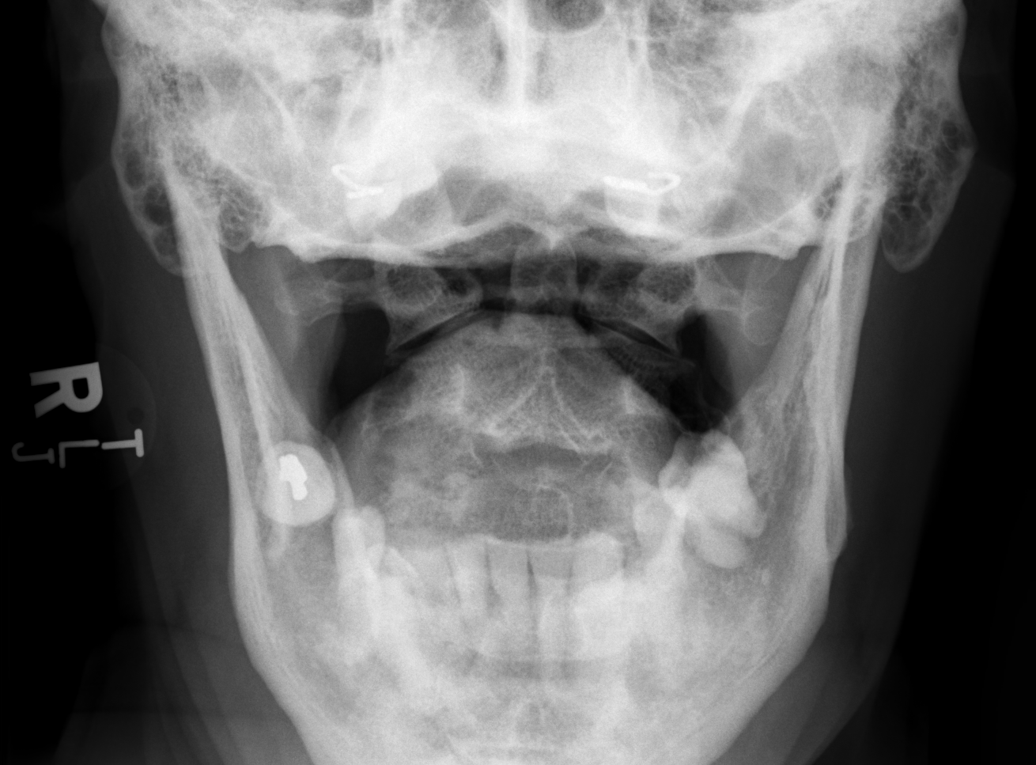

[w cervical spine odontoid (3 of 3)]
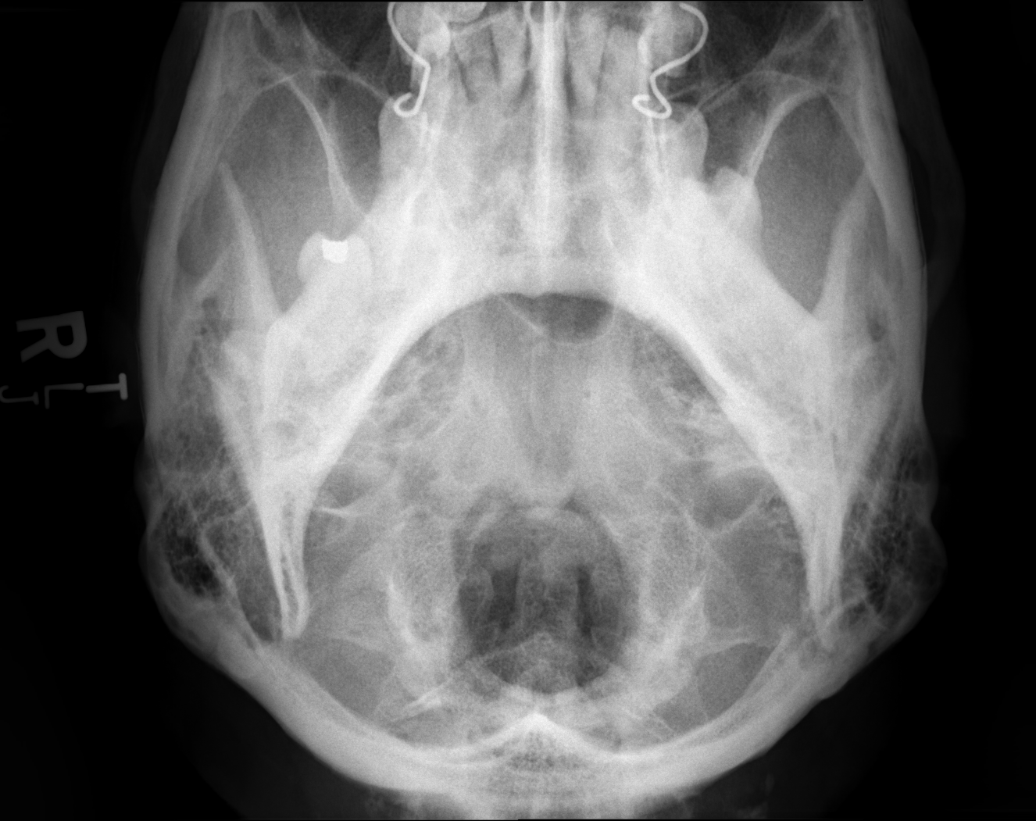

[7 of 7 positions shown; findings below may reference images not displayed]

FINDINGS: There is no acute fracture or subluxation of the cervical spine.
There is straightening of normal cervical lordosis which may be
positional or due to muscle spasm. Multilevel degenerative changes
with disc space narrowing and endplate irregularity and spurring
most prominent at C5-C6. Bilateral neural foramina narrowing most
prominent at C3-C4 on the right and C4-C5 and C5-C6 on the left. The
visualized posterior elements and odontoid appear intact. There is
anatomic alignment of the lateral masses of C1 and C2. There is
multilevel facet arthropathy. The soft tissues are unremarkable.
IMPRESSION: 1. No acute fracture or subluxation.
2. Multilevel degenerative changes.

## 2023-01-19 ENCOUNTER — Ambulatory Visit
Admission: RE | Admit: 2023-01-19 | Discharge: 2023-01-19 | Disposition: A | Discharge status: Home / Self Care | Payer: Medicare Other | Source: Ambulatory Visit | Attending: Family Medicine | Admitting: Family Medicine

## 2023-01-19 ENCOUNTER — Other Ambulatory Visit: Payer: Self-pay | Admitting: Family Medicine

## 2023-01-19 DIAGNOSIS — R059 Cough, unspecified: Secondary | ICD-10-CM

## 2023-01-19 DIAGNOSIS — E782 Mixed hyperlipidemia: Secondary | ICD-10-CM | POA: Diagnosis not present

## 2023-01-19 DIAGNOSIS — D6959 Other secondary thrombocytopenia: Secondary | ICD-10-CM | POA: Diagnosis not present

## 2023-01-19 DIAGNOSIS — J9811 Atelectasis: Secondary | ICD-10-CM | POA: Diagnosis not present

## 2023-01-19 DIAGNOSIS — E559 Vitamin D deficiency, unspecified: Secondary | ICD-10-CM | POA: Diagnosis not present

## 2023-01-19 DIAGNOSIS — Z0001 Encounter for general adult medical examination with abnormal findings: Secondary | ICD-10-CM | POA: Diagnosis not present

## 2023-01-19 DIAGNOSIS — I10 Essential (primary) hypertension: Secondary | ICD-10-CM | POA: Diagnosis not present

## 2023-01-19 DIAGNOSIS — D509 Iron deficiency anemia, unspecified: Secondary | ICD-10-CM | POA: Diagnosis not present

## 2023-02-02 ENCOUNTER — Other Ambulatory Visit: Payer: Self-pay | Admitting: Family Medicine

## 2023-02-02 DIAGNOSIS — J9811 Atelectasis: Secondary | ICD-10-CM | POA: Diagnosis not present

## 2023-02-02 DIAGNOSIS — R059 Cough, unspecified: Secondary | ICD-10-CM | POA: Diagnosis not present

## 2023-02-02 DIAGNOSIS — Z87891 Personal history of nicotine dependence: Secondary | ICD-10-CM

## 2023-02-02 DIAGNOSIS — D6959 Other secondary thrombocytopenia: Secondary | ICD-10-CM | POA: Diagnosis not present

## 2023-02-22 ENCOUNTER — Other Ambulatory Visit: Payer: Self-pay | Admitting: Family Medicine

## 2023-02-22 DIAGNOSIS — Z87891 Personal history of nicotine dependence: Secondary | ICD-10-CM

## 2023-02-24 ENCOUNTER — Ambulatory Visit
Admission: RE | Admit: 2023-02-24 | Discharge: 2023-02-24 | Disposition: A | Discharge status: Home / Self Care | Payer: Medicare Other | Source: Ambulatory Visit | Attending: Family Medicine | Admitting: Family Medicine

## 2023-02-24 DIAGNOSIS — Z87891 Personal history of nicotine dependence: Secondary | ICD-10-CM

## 2023-02-24 DIAGNOSIS — I700 Atherosclerosis of aorta: Secondary | ICD-10-CM | POA: Diagnosis not present

## 2023-02-24 DIAGNOSIS — R059 Cough, unspecified: Secondary | ICD-10-CM | POA: Diagnosis not present

## 2023-03-07 DIAGNOSIS — R3915 Urgency of urination: Secondary | ICD-10-CM | POA: Diagnosis not present

## 2023-03-28 DIAGNOSIS — E782 Mixed hyperlipidemia: Secondary | ICD-10-CM | POA: Diagnosis not present

## 2023-03-28 DIAGNOSIS — J209 Acute bronchitis, unspecified: Secondary | ICD-10-CM | POA: Diagnosis not present

## 2023-03-28 DIAGNOSIS — R059 Cough, unspecified: Secondary | ICD-10-CM | POA: Diagnosis not present

## 2023-03-28 DIAGNOSIS — I10 Essential (primary) hypertension: Secondary | ICD-10-CM | POA: Diagnosis not present

## 2023-04-28 DIAGNOSIS — E782 Mixed hyperlipidemia: Secondary | ICD-10-CM | POA: Diagnosis not present

## 2023-04-28 DIAGNOSIS — I10 Essential (primary) hypertension: Secondary | ICD-10-CM | POA: Diagnosis not present

## 2023-04-28 DIAGNOSIS — R059 Cough, unspecified: Secondary | ICD-10-CM | POA: Diagnosis not present

## 2023-06-01 ENCOUNTER — Other Ambulatory Visit: Payer: Self-pay | Admitting: Family Medicine

## 2023-06-01 DIAGNOSIS — I700 Atherosclerosis of aorta: Secondary | ICD-10-CM

## 2023-06-15 ENCOUNTER — Ambulatory Visit
Admission: RE | Admit: 2023-06-15 | Discharge: 2023-06-15 | Disposition: A | Discharge status: Home / Self Care | Payer: Medicare Other | Source: Ambulatory Visit | Attending: Family Medicine | Admitting: Family Medicine

## 2023-06-15 DIAGNOSIS — I700 Atherosclerosis of aorta: Secondary | ICD-10-CM

## 2023-06-15 DIAGNOSIS — I2510 Atherosclerotic heart disease of native coronary artery without angina pectoris: Secondary | ICD-10-CM | POA: Diagnosis not present

## 2023-06-15 DIAGNOSIS — R918 Other nonspecific abnormal finding of lung field: Secondary | ICD-10-CM | POA: Diagnosis not present

## 2023-06-15 DIAGNOSIS — K760 Fatty (change of) liver, not elsewhere classified: Secondary | ICD-10-CM | POA: Diagnosis not present

## 2023-06-28 DIAGNOSIS — I10 Essential (primary) hypertension: Secondary | ICD-10-CM | POA: Diagnosis not present

## 2023-06-28 DIAGNOSIS — R42 Dizziness and giddiness: Secondary | ICD-10-CM | POA: Diagnosis not present

## 2023-06-28 DIAGNOSIS — E782 Mixed hyperlipidemia: Secondary | ICD-10-CM | POA: Diagnosis not present

## 2023-06-28 DIAGNOSIS — R739 Hyperglycemia, unspecified: Secondary | ICD-10-CM | POA: Diagnosis not present

## 2023-10-25 DIAGNOSIS — H2513 Age-related nuclear cataract, bilateral: Secondary | ICD-10-CM | POA: Diagnosis not present

## 2023-10-25 DIAGNOSIS — H40033 Anatomical narrow angle, bilateral: Secondary | ICD-10-CM | POA: Diagnosis not present

## 2023-11-01 DIAGNOSIS — E782 Mixed hyperlipidemia: Secondary | ICD-10-CM | POA: Diagnosis not present

## 2023-11-01 DIAGNOSIS — D509 Iron deficiency anemia, unspecified: Secondary | ICD-10-CM | POA: Diagnosis not present

## 2023-11-01 DIAGNOSIS — R7303 Prediabetes: Secondary | ICD-10-CM | POA: Diagnosis not present

## 2023-11-01 DIAGNOSIS — E559 Vitamin D deficiency, unspecified: Secondary | ICD-10-CM | POA: Diagnosis not present

## 2023-11-01 DIAGNOSIS — J984 Other disorders of lung: Secondary | ICD-10-CM | POA: Diagnosis not present

## 2023-11-01 DIAGNOSIS — M6281 Muscle weakness (generalized): Secondary | ICD-10-CM | POA: Diagnosis not present

## 2023-11-01 DIAGNOSIS — R42 Dizziness and giddiness: Secondary | ICD-10-CM | POA: Diagnosis not present

## 2023-11-01 DIAGNOSIS — I10 Essential (primary) hypertension: Secondary | ICD-10-CM | POA: Diagnosis not present

## 2023-12-07 ENCOUNTER — Other Ambulatory Visit: Payer: Self-pay | Admitting: Family Medicine

## 2023-12-07 DIAGNOSIS — R911 Solitary pulmonary nodule: Secondary | ICD-10-CM

## 2023-12-21 ENCOUNTER — Ambulatory Visit
Admission: RE | Admit: 2023-12-21 | Discharge: 2023-12-21 | Disposition: A | Discharge status: Home / Self Care | Source: Ambulatory Visit | Attending: Family Medicine | Admitting: Family Medicine

## 2023-12-21 DIAGNOSIS — R911 Solitary pulmonary nodule: Secondary | ICD-10-CM | POA: Diagnosis not present

## 2023-12-26 DIAGNOSIS — Z1212 Encounter for screening for malignant neoplasm of rectum: Secondary | ICD-10-CM | POA: Diagnosis not present

## 2023-12-26 DIAGNOSIS — Z1211 Encounter for screening for malignant neoplasm of colon: Secondary | ICD-10-CM | POA: Diagnosis not present

## 2024-01-03 DIAGNOSIS — M130 Polyarthritis, unspecified: Secondary | ICD-10-CM | POA: Diagnosis not present

## 2024-01-03 DIAGNOSIS — E7849 Other hyperlipidemia: Secondary | ICD-10-CM | POA: Diagnosis not present

## 2024-01-03 DIAGNOSIS — J984 Other disorders of lung: Secondary | ICD-10-CM | POA: Diagnosis not present

## 2024-01-03 DIAGNOSIS — M1712 Unilateral primary osteoarthritis, left knee: Secondary | ICD-10-CM | POA: Diagnosis not present

## 2024-01-03 DIAGNOSIS — M1612 Unilateral primary osteoarthritis, left hip: Secondary | ICD-10-CM | POA: Diagnosis not present

## 2024-01-03 DIAGNOSIS — I10 Essential (primary) hypertension: Secondary | ICD-10-CM | POA: Diagnosis not present

## 2024-01-19 DIAGNOSIS — R3915 Urgency of urination: Secondary | ICD-10-CM | POA: Diagnosis not present

## 2024-01-24 DIAGNOSIS — M7062 Trochanteric bursitis, left hip: Secondary | ICD-10-CM | POA: Diagnosis not present

## 2024-03-09 ENCOUNTER — Other Ambulatory Visit: Payer: Self-pay | Admitting: Urology

## 2024-03-09 DIAGNOSIS — R972 Elevated prostate specific antigen [PSA]: Secondary | ICD-10-CM

## 2024-04-19 ENCOUNTER — Ambulatory Visit
Admission: RE | Admit: 2024-04-19 | Discharge: 2024-04-19 | Disposition: A | Discharge status: Home / Self Care | Source: Ambulatory Visit | Attending: Urology | Admitting: Urology

## 2024-04-19 DIAGNOSIS — R972 Elevated prostate specific antigen [PSA]: Secondary | ICD-10-CM

## 2024-04-19 MED ORDER — GADOPICLENOL 0.5 MMOL/ML IV SOLN
10.0000 mL | Freq: Once | INTRAVENOUS | Status: AC | PRN
  Administered 2024-04-19: 10 mL via INTRAVENOUS

## 2024-05-29 DIAGNOSIS — E559 Vitamin D deficiency, unspecified: Secondary | ICD-10-CM | POA: Diagnosis not present

## 2024-05-29 DIAGNOSIS — I10 Essential (primary) hypertension: Secondary | ICD-10-CM | POA: Diagnosis not present

## 2024-05-29 DIAGNOSIS — R7303 Prediabetes: Secondary | ICD-10-CM | POA: Diagnosis not present

## 2024-05-29 DIAGNOSIS — E782 Mixed hyperlipidemia: Secondary | ICD-10-CM | POA: Diagnosis not present
